# Patient Record
Sex: Female | Born: 1970 | Race: White | Hispanic: No | Marital: Married | Smoking: Never smoker
Health system: Southern US, Community
[De-identification: ages and names within clinical notes are randomized; demographics above are authoritative.]

## PROBLEM LIST (undated history)

## (undated) DIAGNOSIS — K5792 Diverticulitis of intestine, part unspecified, without perforation or abscess without bleeding: Secondary | ICD-10-CM

## (undated) DIAGNOSIS — E079 Disorder of thyroid, unspecified: Secondary | ICD-10-CM

---
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
## 1999-10-10 ENCOUNTER — Other Ambulatory Visit: Admission: RE | Admit: 1999-10-10 | Discharge: 1999-10-10 | Payer: Self-pay | Admitting: Gynecology

## 1999-12-21 ENCOUNTER — Other Ambulatory Visit: Admission: RE | Admit: 1999-12-21 | Discharge: 1999-12-21 | Payer: Self-pay | Admitting: Obstetrics and Gynecology

## 2001-03-13 ENCOUNTER — Other Ambulatory Visit: Admission: RE | Admit: 2001-03-13 | Discharge: 2001-03-13 | Payer: Self-pay | Admitting: Obstetrics and Gynecology

## 2001-10-24 ENCOUNTER — Other Ambulatory Visit: Admission: RE | Admit: 2001-10-24 | Discharge: 2001-10-24 | Payer: Self-pay | Admitting: Obstetrics and Gynecology

## 2001-10-30 ENCOUNTER — Encounter: Payer: Self-pay | Admitting: Surgery

## 2001-10-30 ENCOUNTER — Encounter: Admission: RE | Admit: 2001-10-30 | Discharge: 2001-10-30 | Payer: Self-pay | Admitting: Surgery

## 2004-07-27 ENCOUNTER — Emergency Department: Payer: Self-pay | Admitting: Internal Medicine

## 2006-10-25 ENCOUNTER — Emergency Department: Payer: Self-pay | Admitting: Emergency Medicine

## 2007-08-02 ENCOUNTER — Emergency Department: Payer: Self-pay | Admitting: Emergency Medicine

## 2010-01-25 ENCOUNTER — Encounter: Payer: Self-pay | Admitting: Pediatric Cardiology

## 2012-03-21 ENCOUNTER — Ambulatory Visit: Payer: Self-pay | Admitting: Internal Medicine

## 2012-03-21 LAB — CBC WITH DIFFERENTIAL/PLATELET
Basophil #: 0 10*3/uL (ref 0.0–0.1)
Basophil %: 0.7 %
Eosinophil #: 0.1 10*3/uL (ref 0.0–0.7)
Eosinophil %: 1.4 %
HCT: 40.7 % (ref 35.0–47.0)
HGB: 14 g/dL (ref 12.0–16.0)
Lymphocyte #: 2.3 10*3/uL (ref 1.0–3.6)
Lymphocyte %: 38 %
MCH: 33.5 pg (ref 26.0–34.0)
MCHC: 34.5 g/dL (ref 32.0–36.0)
MCV: 97 fL (ref 80–100)
Monocyte #: 0.5 x10 3/mm (ref 0.2–0.9)
Monocyte %: 8.6 %
Neutrophil #: 3.1 10*3/uL (ref 1.4–6.5)
Neutrophil %: 51.3 %
Platelet: 159 10*3/uL (ref 150–440)
RBC: 4.19 10*6/uL (ref 3.80–5.20)
RDW: 12.4 % (ref 11.5–14.5)
WBC: 6 10*3/uL (ref 3.6–11.0)

## 2012-03-21 LAB — SEDIMENTATION RATE: Erythrocyte Sed Rate: 3 mm/hr (ref 0–20)

## 2013-06-19 IMAGING — CR DG HIP COMPLETE 2+V*L*
1 series · 2 of 2 positions shown · non-contrast
Comparison: none

REASON FOR EXAM: Pain L hip/groin x 8 wks, worsening, esp in last 2d
COMMENTS:

PROCEDURE:     MDR - MDR HIP LEFT COMPLETE  - March 21, 2012  [DATE]
RESULT:     AP and lateral views of the left hip reveal no evidence of
fracture nor dislocation or significant degenerative change. The joint space
is preserved. The bones appear adequately mineralized for age.

[Series 1: ap · 0.17mm/px · 2 of 2 slices shown]
[im 1/2]
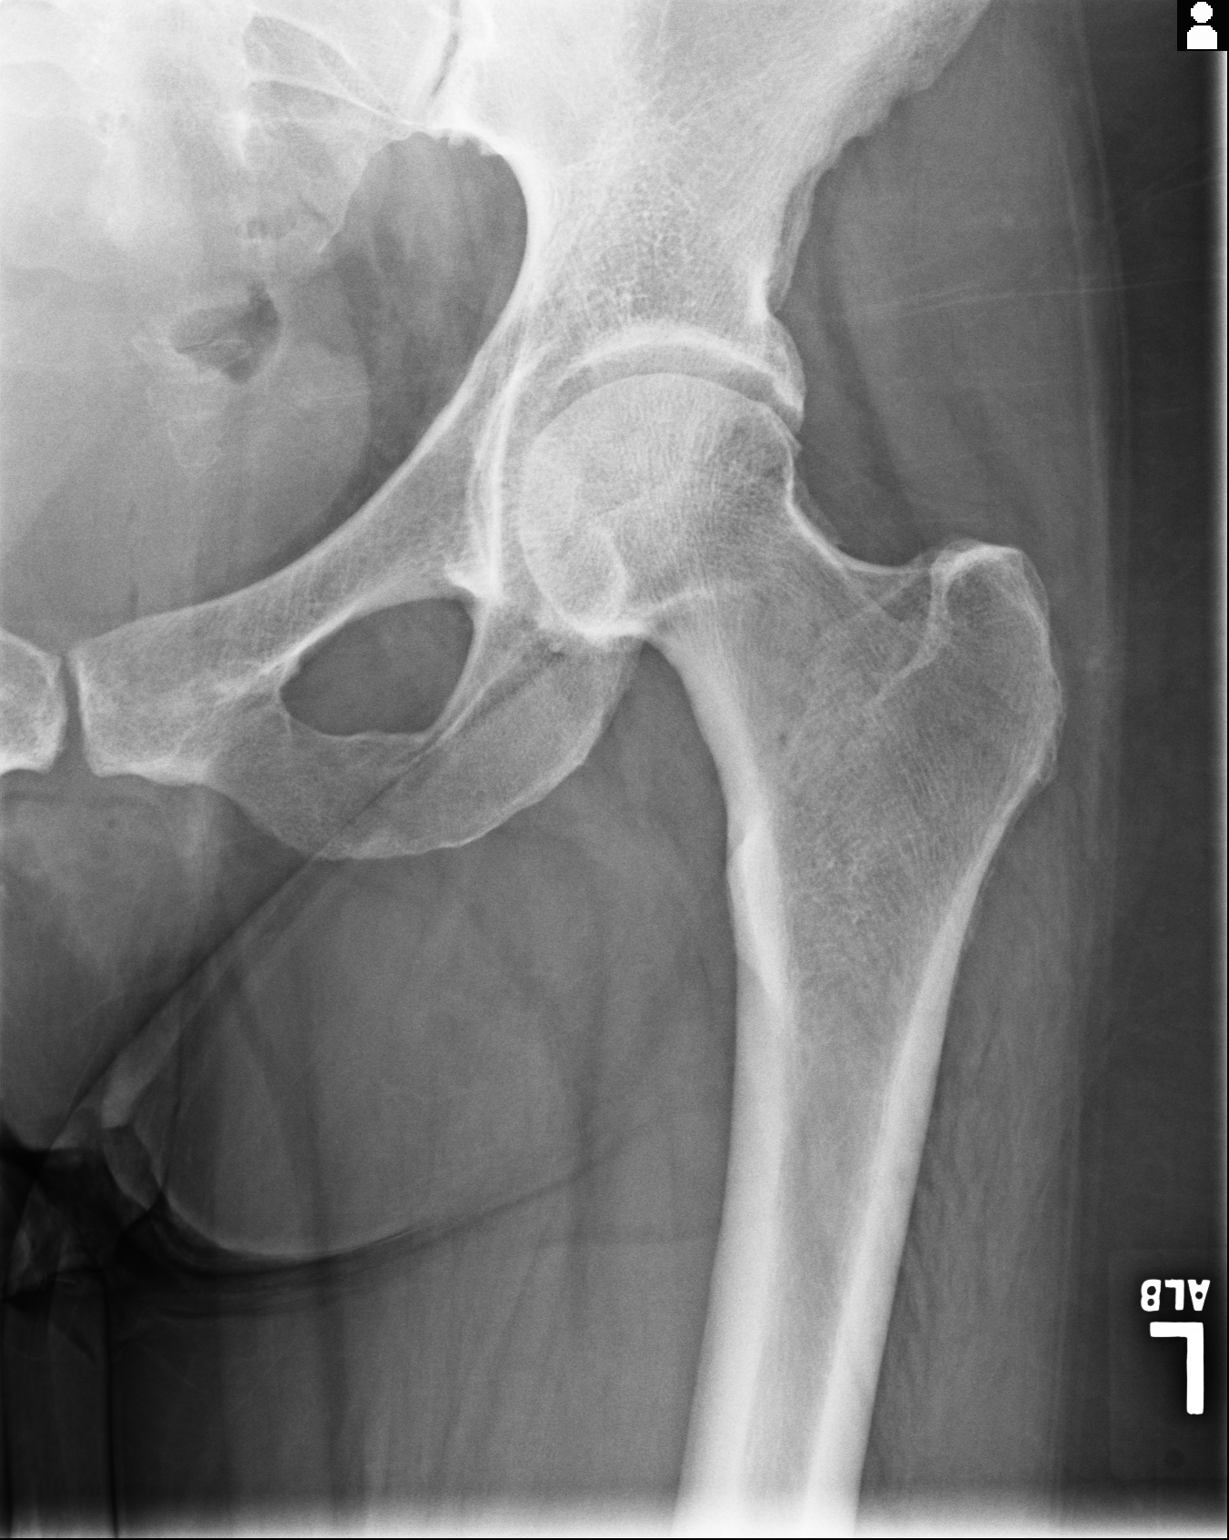
[im 2/2]
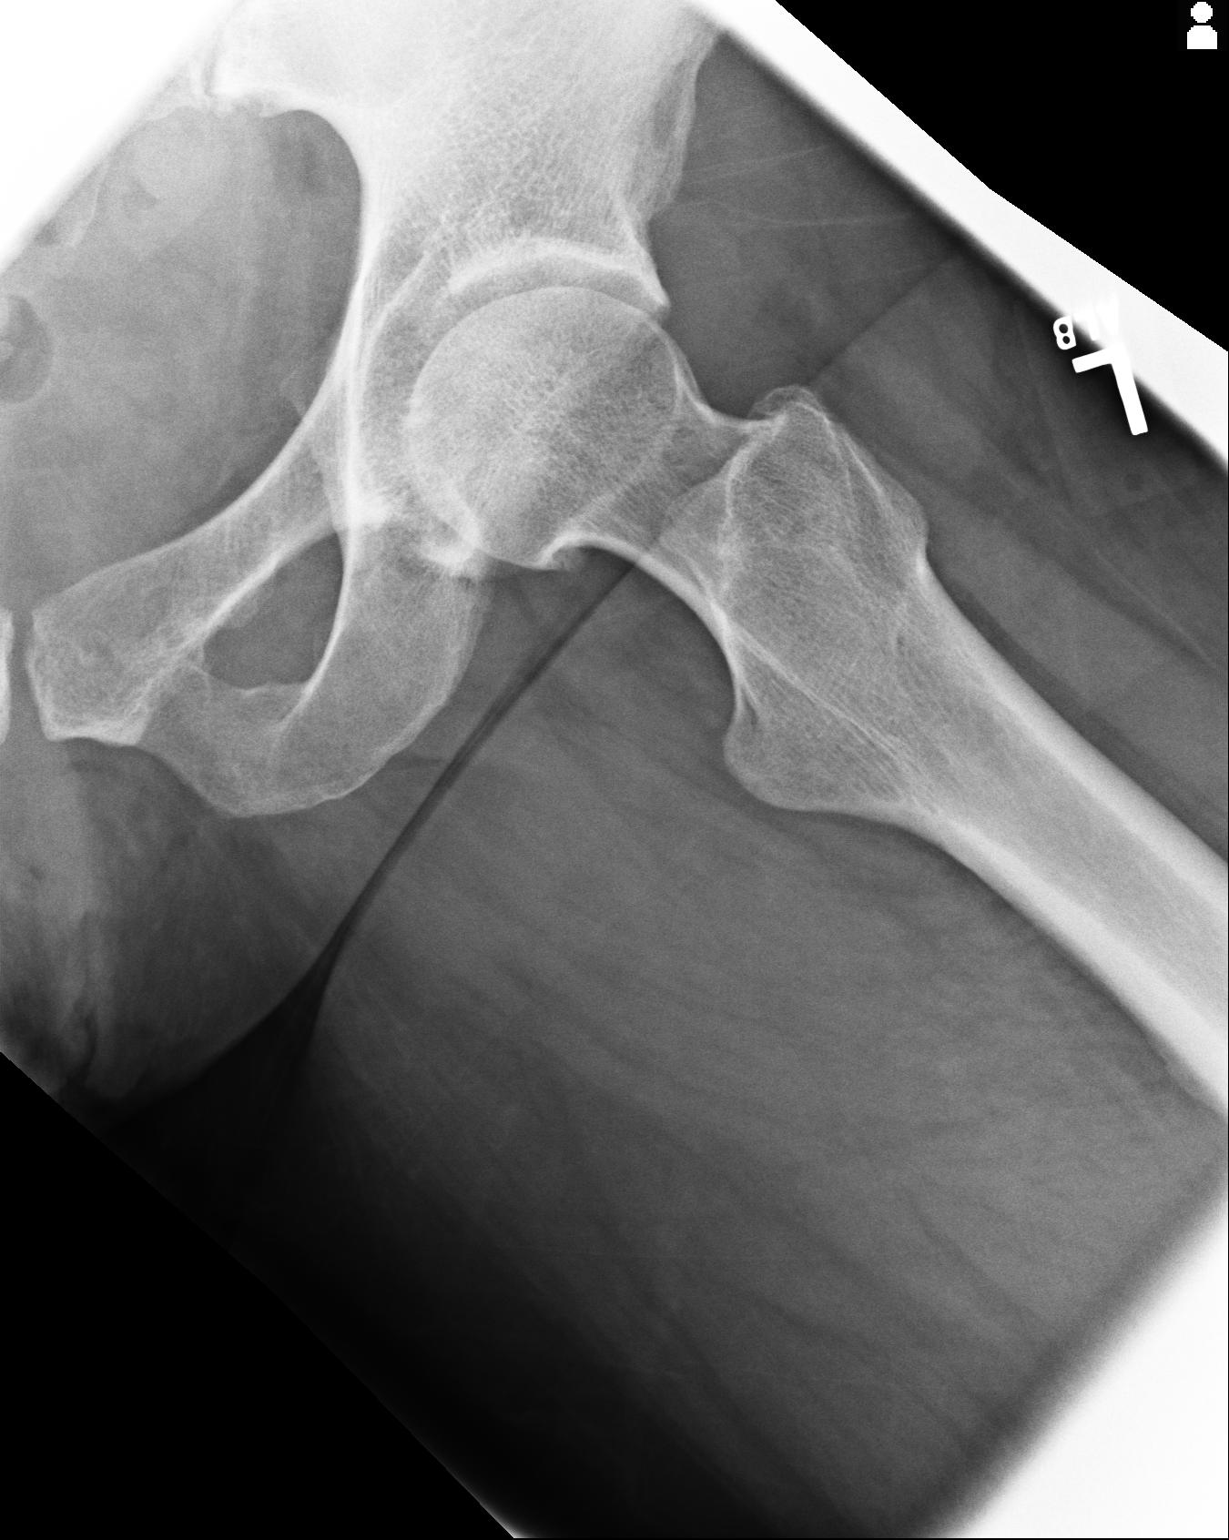

[2 of 2 positions shown; findings below may reference images not displayed]

IMPRESSION: There is no acute bony abnormality of the left hip.

## 2014-03-04 DIAGNOSIS — O09523 Supervision of elderly multigravida, third trimester: Secondary | ICD-10-CM | POA: Insufficient documentation

## 2014-03-04 DIAGNOSIS — Z86718 Personal history of other venous thrombosis and embolism: Secondary | ICD-10-CM | POA: Insufficient documentation

## 2014-03-04 DIAGNOSIS — Z6791 Unspecified blood type, Rh negative: Secondary | ICD-10-CM | POA: Insufficient documentation

## 2014-04-06 DIAGNOSIS — Z8279 Family history of other congenital malformations, deformations and chromosomal abnormalities: Secondary | ICD-10-CM | POA: Insufficient documentation

## 2014-08-04 DIAGNOSIS — Z3A32 32 weeks gestation of pregnancy: Secondary | ICD-10-CM | POA: Insufficient documentation

## 2014-08-04 DIAGNOSIS — O0993 Supervision of high risk pregnancy, unspecified, third trimester: Secondary | ICD-10-CM | POA: Insufficient documentation

## 2014-10-22 DIAGNOSIS — G8929 Other chronic pain: Secondary | ICD-10-CM | POA: Insufficient documentation

## 2014-10-22 DIAGNOSIS — R7302 Impaired glucose tolerance (oral): Secondary | ICD-10-CM | POA: Insufficient documentation

## 2014-10-22 DIAGNOSIS — M419 Scoliosis, unspecified: Secondary | ICD-10-CM | POA: Insufficient documentation

## 2016-06-24 DIAGNOSIS — K625 Hemorrhage of anus and rectum: Secondary | ICD-10-CM | POA: Insufficient documentation

## 2016-06-24 DIAGNOSIS — K529 Noninfective gastroenteritis and colitis, unspecified: Secondary | ICD-10-CM | POA: Insufficient documentation

## 2018-02-18 DIAGNOSIS — R0683 Snoring: Secondary | ICD-10-CM | POA: Insufficient documentation

## 2018-05-14 DIAGNOSIS — Z9884 Bariatric surgery status: Secondary | ICD-10-CM | POA: Insufficient documentation

## 2020-08-08 ENCOUNTER — Other Ambulatory Visit: Payer: Self-pay

## 2020-08-08 ENCOUNTER — Ambulatory Visit
Admission: RE | Admit: 2020-08-08 | Discharge: 2020-08-08 | Disposition: A | Discharge status: Home / Self Care | Payer: Federal, State, Local not specified - PPO | Source: Ambulatory Visit

## 2020-08-08 VITALS — BP 128/81 | HR 80 | Temp 98.7°F | Resp 17

## 2020-08-08 DIAGNOSIS — J4521 Mild intermittent asthma with (acute) exacerbation: Secondary | ICD-10-CM

## 2020-08-08 HISTORY — DX: Disorder of thyroid, unspecified: E07.9

## 2020-08-08 MED ORDER — ALBUTEROL SULFATE HFA 108 (90 BASE) MCG/ACT IN AERS: 2.0000 | INHALATION_SPRAY | RESPIRATORY_TRACT | 0 refills | Status: DC | PRN

## 2020-08-08 MED ORDER — AEROCHAMBER MV MISC: 2 refills | Status: DC

## 2020-08-08 MED ORDER — BENZONATATE 100 MG PO CAPS: 200.0000 mg | ORAL_CAPSULE | Freq: Three times a day (TID) | ORAL | 0 refills | Status: DC

## 2020-08-08 NOTE — ED Provider Notes (Signed)
[facility]    CSN: [id number] Arrival date & time: 08/08/20  1345      History   Chief Complaint Chief Complaint  Patient presents with  . Appointment  . Post Covid 07/25/2020    HPI Kristi White is a 49 y.o. female.   HPI   49 year old female here for evaluation of lingering cough post Covid.  Patient is currently on day 14 post Covid.  She just finished a Z-Pak prescribed by her primary care provider.  Patient states that she is having a productive cough for a yellow sputum, wheezing at night, has had a bit of a runny nose and some sinus pressure.  Patient denies fever or shortness of breath.  Past Medical History:  Diagnosis Date  . Thyroid disease     There are no problems to display for this patient.   History reviewed. No pertinent surgical history.  OB History   No obstetric history on file.      Home Medications    Prior to Admission medications   Medication Sig Start Date End Date Taking? Authorizing Provider  levothyroxine (SYNTHROID) 150 MCG tablet Take 150 mcg by mouth daily before breakfast.   Yes [provider]  albuterol (VENTOLIN HFA) 108 (90 Base) MCG/ACT inhaler Inhale 2 puffs into the lungs every 4 (four) hours as needed for wheezing or shortness of breath. 08/08/20   Becky Augusta, NP  benzonatate (TESSALON) 100 MG capsule Take 2 capsules (200 mg total) by mouth every 8 (eight) hours. 08/08/20   Becky Augusta, NP  Spacer/Aero-Holding Deretha Emory (AEROCHAMBER MV) inhaler Use as instructed 08/08/20   Becky Augusta, NP    Family History Family History  Family history unknown: Yes    Social History Social History   Tobacco Use  . Smoking status: Never Smoker  Substance Use Topics  . Alcohol use: Not on file  . Drug use: Not on file     Allergies   Patient has no known allergies.   Review of Systems Review of Systems  Constitutional: Negative for activity change, appetite change and fever.  HENT: Positive for  rhinorrhea and sinus pressure. Negative for congestion and sore throat.   Respiratory: Positive for cough and wheezing. Negative for shortness of breath.   Cardiovascular: Negative for chest pain.  Musculoskeletal: Negative for arthralgias and myalgias.  Skin: Negative for rash.  Neurological: Negative for headaches.  Hematological: Negative.   Psychiatric/Behavioral: Negative.      Physical Exam Triage Vital Signs ED Triage Vitals  Enc Vitals Group     BP 08/08/20 1434 128/81     Pulse Rate 08/08/20 1434 80     Resp 08/08/20 1434 17     Temp 08/08/20 1434 98.7 F (37.1 C)     Temp Source 08/08/20 1434 Oral     SpO2 08/08/20 1434 100 %     Weight --      Height --      Head Circumference --      Peak Flow --      Pain Score 08/08/20 1428 4     Pain Loc --      Pain Edu? --      Excl. in GC? --    No data found.  Updated Vital Signs BP 128/81 (BP Location: Left Arm)   Pulse 80   Temp 98.7 F (37.1 C) (Oral)   Resp 17   SpO2 100%   Visual Acuity Right Eye Distance:   Left Eye  Distance:   Bilateral Distance:    Right Eye Near:   Left Eye Near:    Bilateral Near:     Physical Exam Vitals and nursing note reviewed.  Constitutional:      General: She is not in acute distress.    Appearance: Normal appearance. She is normal weight. She is not toxic-appearing.  HENT:     Head: Normocephalic and atraumatic.     Right Ear: Tympanic membrane, ear canal and external ear normal.     Left Ear: Tympanic membrane, ear canal and external ear normal.     Nose: Nose normal. No congestion or rhinorrhea.     Mouth/Throat:     Mouth: Mucous membranes are moist.     Pharynx: Oropharynx is clear. No oropharyngeal exudate or posterior oropharyngeal erythema.  Eyes:     General: No scleral icterus.    Extraocular Movements: Extraocular movements intact.     Conjunctiva/sclera: Conjunctivae normal.     Pupils: Pupils are equal, round, and reactive to light.  Cardiovascular:       Rate and Rhythm: Normal rate and regular rhythm.     Pulses: Normal pulses.     Heart sounds: Normal heart sounds. No murmur heard.  No gallop.   Pulmonary:     Effort: Pulmonary effort is normal.     Breath sounds: Wheezing present. No rhonchi or rales.  Musculoskeletal:        General: No swelling or tenderness. Normal range of motion.     Cervical back: Normal range of motion and neck supple.  Lymphadenopathy:     Cervical: No cervical adenopathy.  Skin:    General: Skin is warm and dry.     Capillary Refill: Capillary refill takes less than 2 seconds.     Findings: No erythema or rash.  Neurological:     General: No focal deficit present.     Mental Status: She is alert and oriented to person, place, and time.  Psychiatric:        Mood and Affect: Mood normal.        Behavior: Behavior normal.        Thought Content: Thought content normal.        Judgment: Judgment normal.      UC Treatments / Results  Labs (all labs ordered are listed, but only abnormal results are displayed) Labs Reviewed - No data to display  EKG   Radiology No results found.  Procedures Procedures (including critical care time)  Medications Ordered in UC Medications - No data to display  Initial Impression / Assessment and Plan / UC Course  I have reviewed the triage vital signs and the nursing notes.  Pertinent labs & imaging results that were available during my care of the patient were reviewed by me and considered in my medical decision making (see chart for details).   Of continued cough that is productive for yellow sputum and wheezing at night 14 days post Covid.  Patient has remote history of asthma but does not have a rescue inhaler at present.  Patient states that she has had a runny nose and some sinus pressure.  Physical exam reveals pink and moist nasal mucosa with mild edema and no discharge.  No postnasal drip visualized in the posterior oropharynx.  Lung sounds are  decreased in all fields with wheezing present.  Suspect patient has an asthma exacerbation and that is what is driving her cough.  We will treat with albuterol inhaler and  spacer, give Tessalon Perles to help with cough as the Robitussin with codeine that she was prescribed by her PCP makes her too sleepy.   Final Clinical Impressions(s) / UC Diagnoses   Final diagnoses:  Mild intermittent asthma with acute exacerbation     Discharge Instructions     Use the inhaler, 2 puffs every 4-6 hours, as needed for cough and wheezing.  Use it with the spacer.  Use the Tessalon Perles every 8 hours as needed for cough.  Increase your oral fluid intake to keep your mucus thin and help expectorate your sputum.  You can continue your Mucinex.  If your symptoms persist you may need a round of steroids and I would recommend following up with your PCP.    ED Prescriptions    Medication Sig Dispense Auth. Provider   albuterol (VENTOLIN HFA) 108 (90 Base) MCG/ACT inhaler Inhale 2 puffs into the lungs every 4 (four) hours as needed for wheezing or shortness of breath. 18 g Becky Augusta, NP   Spacer/Aero-Holding Chambers (AEROCHAMBER MV) inhaler Use as instructed 1 each Becky Augusta, NP   benzonatate (TESSALON) 100 MG capsule Take 2 capsules (200 mg total) by mouth every 8 (eight) hours. 21 capsule Becky Augusta, NP     PDMP not reviewed this encounter.   Becky Augusta, NP 08/08/20 [phone]

## 2020-08-08 NOTE — Discharge Instructions (Addendum)
Use the inhaler, 2 puffs every 4-6 hours, as needed for cough and wheezing.  Use it with the spacer.  Use the Tessalon Perles every 8 hours as needed for cough.  Increase your oral fluid intake to keep your mucus thin and help expectorate your sputum.  You can continue your Mucinex.  If your symptoms persist you may need a round of steroids and I would recommend following up with your PCP.

## 2020-08-08 NOTE — ED Triage Notes (Signed)
Pt presents for possible chest x ray post covid because of ongoing cough with her given asthma/bronchitis history

## 2020-11-28 ENCOUNTER — Other Ambulatory Visit: Payer: Self-pay

## 2020-11-28 ENCOUNTER — Ambulatory Visit
Admission: RE | Admit: 2020-11-28 | Discharge: 2020-11-28 | Disposition: A | Discharge status: Home / Self Care | Payer: Federal, State, Local not specified - PPO | Source: Ambulatory Visit | Attending: Family Medicine | Admitting: Family Medicine

## 2020-11-28 VITALS — BP 133/99 | HR 79 | Temp 97.9°F | Resp 18 | Ht 67.0 in | Wt 228.0 lb

## 2020-11-28 DIAGNOSIS — K5732 Diverticulitis of large intestine without perforation or abscess without bleeding: Secondary | ICD-10-CM

## 2020-11-28 HISTORY — DX: Diverticulitis of intestine, part unspecified, without perforation or abscess without bleeding: K57.92

## 2020-11-28 MED ORDER — METRONIDAZOLE 500 MG PO TABS: 500.0000 mg | ORAL_TABLET | Freq: Three times a day (TID) | ORAL | 0 refills | Status: AC

## 2020-11-28 MED ORDER — ONDANSETRON 4 MG PO TBDP: 4.0000 mg | ORAL_TABLET | Freq: Three times a day (TID) | ORAL | 0 refills | Status: DC | PRN

## 2020-11-28 MED ORDER — CIPROFLOXACIN HCL 500 MG PO TABS: 500.0000 mg | ORAL_TABLET | Freq: Two times a day (BID) | ORAL | 0 refills | Status: DC

## 2020-11-28 NOTE — ED Provider Notes (Signed)
[facility]    CSN: [id number] Arrival date & time: 11/28/20  0901      History   Chief Complaint Chief Complaint  Patient presents with  . Diarrhea  . Abdominal Pain   HPI  50 year old female presents with the above complaints.  Symptoms started Friday. She reports fever (101), nausea, vomiting, diarrhea, LLQ pain. Has a history of diverticulitis. She believes that this is the culprit of her symptoms. She has taken tylenol with improvement in fever. Pain 3/10 in severity. No other reported symptoms. No other complaints.  Past Medical History:  Diagnosis Date  . Diverticulitis   . Thyroid disease    Home Medications    Prior to Admission medications   Medication Sig Start Date End Date Taking? Authorizing Provider  albuterol (VENTOLIN HFA) 108 (90 Base) MCG/ACT inhaler Inhale 2 puffs into the lungs every 4 (four) hours as needed for wheezing or shortness of breath. 08/08/20  Yes Becky Augusta, NP  ciprofloxacin (CIPRO) 500 MG tablet Take 1 tablet (500 mg total) by mouth 2 (two) times daily. 11/28/20  Yes Jalie Eiland G, DO  gabapentin (NEURONTIN) 300 MG capsule Take 300 mg by mouth at bedtime. 09/07/20  Yes [provider]  levothyroxine (SYNTHROID) 150 MCG tablet Take 150 mcg by mouth daily before breakfast.   Yes [provider]  metroNIDAZOLE (FLAGYL) 500 MG tablet Take 1 tablet (500 mg total) by mouth 3 (three) times daily for 7 days. 11/28/20 12/05/20 Yes Fayth Trefry G, DO  ondansetron (ZOFRAN ODT) 4 MG disintegrating tablet Take 1 tablet (4 mg total) by mouth every 8 (eight) hours as needed for nausea or vomiting. 11/28/20  Yes Tommie Sams, DO    Family History Family History  Problem Relation Age of Onset  . Cancer Mother   . Stroke Father     Social History Social History   Tobacco Use  . Smoking status: Never Smoker  . Smokeless tobacco: Never Used  Vaping Use  . Vaping Use: Never used  Substance Use Topics  . Drug use: Never      Allergies   Latex   Review of Systems Review of Systems  Constitutional: Positive for fever.  Gastrointestinal: Positive for abdominal pain, diarrhea and nausea.   Physical Exam Triage Vital Signs ED Triage Vitals  Enc Vitals Group     BP 11/28/20 0913 (!) 133/99     Pulse Rate 11/28/20 0913 79     Resp 11/28/20 0913 18     Temp 11/28/20 0913 97.9 F (36.6 C)     Temp Source 11/28/20 0913 Oral     SpO2 11/28/20 0913 99 %     Weight 11/28/20 0911 228 lb (103.4 kg)     Height 11/28/20 0911 5\' 7"  (1.702 m)     Head Circumference --      Peak Flow --      Pain Score 11/28/20 0910 3     Pain Loc --      Pain Edu? --      Excl. in GC? --    Updated Vital Signs BP (!) 133/99 (BP Location: Left Arm)   Pulse 79   Temp 97.9 F (36.6 C) (Oral)   Resp 18   Ht 5\' 7"  (1.702 m)   Wt 103.4 kg   SpO2 99%   BMI 35.71 kg/m   Visual Acuity Right Eye Distance:   Left Eye Distance:   Bilateral Distance:    Right Eye  Near:   Left Eye Near:    Bilateral Near:     Physical Exam Vitals and nursing note reviewed.  Constitutional:      General: She is not in acute distress.    Appearance: Normal appearance. She is not ill-appearing.  HENT:     Head: Normocephalic and atraumatic.  Eyes:     General:        Right eye: No discharge.        Left eye: No discharge.     Conjunctiva/sclera: Conjunctivae normal.  Cardiovascular:     Rate and Rhythm: Normal rate and regular rhythm.     Heart sounds: No murmur heard.   Pulmonary:     Effort: Pulmonary effort is normal.     Breath sounds: Normal breath sounds. No wheezing, rhonchi or rales.  Abdominal:     General: There is no distension.     Palpations: Abdomen is soft.     Comments: Mild LLQ tenderness to palpation.  Neurological:     Mental Status: She is alert.  Psychiatric:        Mood and Affect: Mood normal.        Behavior: Behavior normal.    UC Treatments / Results  Labs (all labs ordered are listed, but  only abnormal results are displayed) Labs Reviewed - No data to display  EKG   Radiology No results found.  Procedures Procedures (including critical care time)  Medications Ordered in UC Medications - No data to display  Initial Impression / Assessment and Plan / UC Course  I have reviewed the triage vital signs and the nursing notes.  Pertinent labs & imaging results that were available during my care of the patient were reviewed by me and considered in my medical decision making (see chart for details).    50 year old female presents with diverticulitis. Acute illness with systemic symptoms (has had fever). Treating with Cipro and Flagyl. PRN Zofran. Work note given.  Final Clinical Impressions(s) / UC Diagnoses   Final diagnoses:  Diverticulitis of colon   Discharge Instructions   None    ED Prescriptions    Medication Sig Dispense Auth. Provider   ciprofloxacin (CIPRO) 500 MG tablet Take 1 tablet (500 mg total) by mouth 2 (two) times daily. 14 tablet Bartlomiej Jenkinson G, DO   metroNIDAZOLE (FLAGYL) 500 MG tablet Take 1 tablet (500 mg total) by mouth 3 (three) times daily for 7 days. 21 tablet Clessie Karras G, DO   ondansetron (ZOFRAN ODT) 4 MG disintegrating tablet Take 1 tablet (4 mg total) by mouth every 8 (eight) hours as needed for nausea or vomiting. 20 tablet Tommie Sams, DO     PDMP not reviewed this encounter.   Tommie Sams, [place] 11/28/20 [phone]

## 2020-11-28 NOTE — ED Triage Notes (Signed)
Pt c/o nausea, emesis, diarrhea, low fever, LLQ pain since Friday. Pt is concerned about a diverticulitis flare.

## 2021-03-23 DIAGNOSIS — E7439 Other disorders of intestinal carbohydrate absorption: Secondary | ICD-10-CM | POA: Insufficient documentation

## 2022-03-31 DIAGNOSIS — E538 Deficiency of other specified B group vitamins: Secondary | ICD-10-CM | POA: Insufficient documentation

## 2022-03-31 DIAGNOSIS — Z833 Family history of diabetes mellitus: Secondary | ICD-10-CM | POA: Insufficient documentation

## 2022-07-26 ENCOUNTER — Ambulatory Visit: Payer: Federal, State, Local not specified - PPO | Admitting: Podiatry

## 2022-11-22 ENCOUNTER — Ambulatory Visit: Payer: Federal, State, Local not specified - PPO | Admitting: Podiatry

## 2022-12-13 ENCOUNTER — Encounter: Payer: Self-pay | Admitting: Podiatry

## 2022-12-13 ENCOUNTER — Ambulatory Visit: Payer: Federal, State, Local not specified - PPO | Admitting: Podiatry

## 2022-12-13 ENCOUNTER — Ambulatory Visit (INDEPENDENT_AMBULATORY_CARE_PROVIDER_SITE_OTHER): Payer: Federal, State, Local not specified - PPO

## 2022-12-13 DIAGNOSIS — K449 Diaphragmatic hernia without obstruction or gangrene: Secondary | ICD-10-CM | POA: Insufficient documentation

## 2022-12-13 DIAGNOSIS — M6740 Ganglion, unspecified site: Secondary | ICD-10-CM

## 2022-12-13 NOTE — Progress Notes (Signed)
  Subjective:  Patient ID: Kristi White, female    DOB: [birth year],  MRN: [id number] HPI Chief Complaint  Patient presents with   Toe Pain     2nd toe right - cyst at dipj x 6-8 months, starting to get tender, PCP referred for treatment   New Patient (Initial Visit)    52 y.o. female presents with the above complaint.   ROS: Denies fever chills nausea vomit muscle aches pains calf pain back pain chest pain shortness of breath.  Past Medical History:  Diagnosis Date   Diverticulitis    Thyroid disease    No past surgical history on file.  Current Outpatient Medications:    levothyroxine (SYNTHROID) 150 MCG tablet, Take 150 mcg by mouth daily before breakfast., Disp: , Rfl:   Allergies  Allergen Reactions   Latex Rash   Review of Systems Objective:  There were no vitals filed for this visit.  General: Well developed, nourished, in no acute distress, alert and oriented x3   Dermatological: Skin is warm, dry and supple bilateral. Nails x 10 are well maintained; remaining integument appears unremarkable at this time. There are no open sores, no preulcerative lesions, no rash or signs of infection present.  Vascular: Dorsalis Pedis artery and Posterior Tibial artery pedal pulses are 2/4 bilateral with immedate capillary fill time. Pedal hair growth present. No varicosities and no lower extremity edema present bilateral.   Neruologic: Grossly intact via light touch bilateral. Vibratory intact via tuning fork bilateral. Protective threshold with Semmes Wienstein monofilament intact to all pedal sites bilateral. Patellar and Achilles deep tendon reflexes 2+ bilateral. No Babinski or clonus noted bilateral.   Musculoskeletal: No gross boney pedal deformities bilateral. No pain, crepitus, or limitation noted with foot and ankle range of motion bilateral. Muscular strength 5/5 in all groups tested bilateral.  Mild mallet toe deformity with some early osteoarthritic changes second  digit right foot with a 0.5 cm mucoid cyst to the dorsal lateral aspect of the joint.  Gait: Unassisted, Nonantalgic.    Radiographs:  Radiographs taken today demonstrate an osseously mature foot evaluation of the digits do demonstrate normal osseous architecture however the DIPJ of the second digit of the right foot does demonstrate joint space narrowing subchondral sclerosis and eburnation.  No signs of infection.  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  She tolerated the procedure well antibiotic ointment was placed and a compression dressing was applied recommended that she continue the compression and compression dressing for the next few days.  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     Real Cona T. Harrison, [place]

## 2024-07-11 ENCOUNTER — Ambulatory Visit
Admission: RE | Admit: 2024-07-11 | Discharge: 2024-07-11 | Disposition: A | Discharge status: Home / Self Care | Payer: Self-pay | Attending: Physician Assistant | Admitting: Physician Assistant

## 2024-07-11 VITALS — BP 175/92 | HR 78 | Temp 98.0°F | Resp 15 | Ht 67.0 in | Wt 228.0 lb

## 2024-07-11 DIAGNOSIS — R490 Dysphonia: Secondary | ICD-10-CM

## 2024-07-11 DIAGNOSIS — J069 Acute upper respiratory infection, unspecified: Secondary | ICD-10-CM | POA: Diagnosis not present

## 2024-07-11 DIAGNOSIS — I10 Essential (primary) hypertension: Secondary | ICD-10-CM

## 2024-07-11 DIAGNOSIS — R051 Acute cough: Secondary | ICD-10-CM | POA: Diagnosis not present

## 2024-07-11 DIAGNOSIS — J45901 Unspecified asthma with (acute) exacerbation: Secondary | ICD-10-CM | POA: Diagnosis not present

## 2024-07-11 LAB — POC SOFIA SARS ANTIGEN FIA: SARS Coronavirus 2 Ag: NEGATIVE

## 2024-07-11 MED ORDER — PSEUDOEPH-BROMPHEN-DM 30-2-10 MG/5ML PO SYRP: 10.0000 mL | ORAL_SOLUTION | Freq: Four times a day (QID) | ORAL | 0 refills | Status: AC | PRN

## 2024-07-11 MED ORDER — PREDNISONE 20 MG PO TABS
40.0000 mg | ORAL_TABLET | Freq: Every day | ORAL | 0 refills | Status: AC
Start: 2024-07-11 — End: 2024-07-16

## 2024-07-11 NOTE — Discharge Instructions (Addendum)
-  Negative COVID test.   URI/COLD SYMPTOMS: Your exam today is consistent with a viral illness. Antibiotics are not indicated at this time. Use medications as directed, including cough syrup, nasal saline, and decongestants. Your symptoms should improve over the next few days and resolve within 7-10 days. Increase rest and fluids. F/u if symptoms worsen or predominate such as sore throat, ear pain, productive cough, shortness of breath, or if you develop high fevers or worsening fatigue over the next several days.   

## 2024-07-11 NOTE — ED Provider Notes (Signed)
 [facility]    CSN: [id number] Arrival date & time: 07/11/24  0813      History   Chief Complaint Chief Complaint  Patient presents with   Cough    Appointment    HPI Kristi White is a 53 y.o. female presenting for fatigue, cough, congestion, headaches, and body aches x 3 days. Denies fever, sore throat, ear pain, sinus pain, chest pain, abdominal pain, vomiting or diarrhea. History of RAD. Reports wheezing and mildly increased SOB from baseline.  Patient has been exposed to COVID. Patient has been taking over-the-counter meds--Dayquil and Nyquil. No other complaints.   HPI  Past Medical History:  Diagnosis Date   Diverticulitis    Thyroid disease     Patient Active Problem List   Diagnosis Date Noted   Hiatal hernia 12/13/2022   B12 deficiency 03/31/2022   Family history of type 2 diabetes mellitus 03/31/2022   Glucose intolerance 03/23/2021   History of bariatric surgery 05/14/2018   Morbid (severe) obesity due to excess calories (HCC) 04/26/2018   Snoring 02/18/2018   Chronic diarrhea 06/24/2016   Rectal bleeding 06/24/2016   Chronic back pain 10/22/2014   Glucose intolerance (impaired glucose tolerance) 10/22/2014   Scoliosis of thoracolumbar spine 10/22/2014   [redacted] weeks gestation of pregnancy 08/04/2014   High-risk pregnancy supervision, third trimester 08/04/2014   Family history of congenital heart defect 04/06/2014   AMA (advanced maternal age) multigravida 35+, third trimester 03/04/2014   Blood type, Rh negative 03/04/2014   History of DVT of lower extremity 03/04/2014   Hypothyroidism (acquired) 12/12/1990    History reviewed. No pertinent surgical history.  OB History   No obstetric history on file.      Home Medications    Prior to Admission medications   Medication Sig Start Date End Date Taking? Authorizing Provider  brompheniramine-pseudoephedrine-DM 30-2-10 MG/5ML syrup Take 10 mLs by mouth 4 (four) times daily as needed  for up to 7 days. 07/11/24 07/18/24 Yes Arvis Jolan NOVAK, PA-C  predniSONE (DELTASONE) 20 MG tablet Take 2 tablets (40 mg total) by mouth daily for 5 days. 07/11/24 07/16/24 Yes Arvis Jolan NOVAK, PA-C  levothyroxine (SYNTHROID) 150 MCG tablet Take 150 mcg by mouth daily before breakfast.    [provider]    Family History Family History  Problem Relation Age of Onset   Cancer Mother    Stroke Father     Social History Social History   Tobacco Use   Smoking status: Never   Smokeless tobacco: Never  Vaping Use   Vaping status: Never Used  Substance Use Topics   Drug use: Never     Allergies   Latex   Review of Systems Review of Systems  Constitutional:  Positive for fatigue. Negative for chills, diaphoresis and fever.  HENT:  Positive for congestion, postnasal drip and rhinorrhea. Negative for ear pain, sinus pain and sore throat.   Respiratory:  Positive for cough, shortness of breath and wheezing.   Cardiovascular:  Negative for chest pain.  Gastrointestinal:  Negative for abdominal pain, nausea and vomiting.  Musculoskeletal:  Positive for myalgias.  Skin:  Negative for rash.  Neurological:  Positive for headaches. Negative for weakness.  Hematological:  Negative for adenopathy.     Physical Exam Triage Vital Signs ED Triage Vitals [07/11/24 0822]  Encounter Vitals Group     BP      Girls Systolic BP Percentile      Girls Diastolic BP Percentile  Boys Systolic BP Percentile      Boys Diastolic BP Percentile      Pulse      Resp      Temp      Temp src      SpO2      Weight 227 lb 15.3 oz (103.4 kg)     Height 5' 7 (1.702 m)     Head Circumference      Peak Flow      Pain Score 0     Pain Loc      Pain Education      Exclude from Growth Chart    No data found.  Updated Vital Signs BP (!) 175/92 (BP Location: Right Arm)   Pulse 78   Temp 98 F (36.7 C) (Oral)   Resp 15   Ht 5' 7 (1.702 m)   Wt 227 lb 15.3 oz (103.4 kg)   SpO2 96%    BMI 35.70 kg/m     Physical Exam Vitals and nursing note reviewed.  Constitutional:      General: She is not in acute distress.    Appearance: Normal appearance. She is ill-appearing. She is not toxic-appearing.     Comments: +voice hoarseness  HENT:     Head: Normocephalic and atraumatic.     Right Ear: Tympanic membrane, ear canal and external ear normal.     Left Ear: Tympanic membrane, ear canal and external ear normal.     Nose: Congestion present.     Mouth/Throat:     Mouth: Mucous membranes are moist.     Pharynx: Oropharynx is clear.  Eyes:     General: No scleral icterus.       Right eye: No discharge.        Left eye: No discharge.     Conjunctiva/sclera: Conjunctivae normal.  Cardiovascular:     Rate and Rhythm: Normal rate and regular rhythm.     Heart sounds: Normal heart sounds.  Pulmonary:     Effort: Pulmonary effort is normal. No respiratory distress.     Breath sounds: Normal breath sounds.  Musculoskeletal:     Cervical back: Neck supple.  Skin:    General: Skin is dry.  Neurological:     General: No focal deficit present.     Mental Status: She is alert. Mental status is at baseline.     Motor: No weakness.     Gait: Gait normal.  Psychiatric:        Mood and Affect: Mood normal.        Behavior: Behavior normal.      UC Treatments / Results  Labs (all labs ordered are listed, but only abnormal results are displayed) Labs Reviewed  POC SOFIA SARS ANTIGEN FIA - Normal    EKG   Radiology No results found.  Procedures Procedures (including critical care time)  Medications Ordered in UC Medications - No data to display  Initial Impression / Assessment and Plan / UC Course  I have reviewed the triage vital signs and the nursing notes.  Pertinent labs & imaging results that were available during my care of the patient were reviewed by me and considered in my medical decision making (see chart for details).   53 year old female  presents for cough, congestion, fatigue, headaches and bodyaches x 3 days.  Reports increased wheezing and shortness of breath from baseline.  History of RAD.  Has been using albuterol  more than normal.  Has been exposed to  COVID and needs a test for work.  She is afebrile. BP 174/96.  Recheck is 175/92.  Patient reports her typical blood pressures are 150s over 90s.  She is following up with primary care provider regarding this.  She thinks it is related to her thyroid which is not under control per patient.  Patient also reports trying to work on lifestyle changes to avoid medication.  She will continue to follow with primary care provider regarding elevated blood pressure readings.  She is mildly ill-appearing but nontoxic.  Voice is hoarse.  No acute distress.  On exam has nasal congestion.  Throat is clear.  Chest clear.  Heart regular rate rhythm.  COVID antigen test performed. Negative.  Viral URI and mild RAD flare.  No wheezing at this time the patient reports using her inhaler.  Will treat with Bromfed-DM and prednisone.  Continue inhalers.  Encourage increasing rest and fluids.  Explained viral illnesses can last a few weeks but she should return if she is having sinus discomfort after 10 days, fevers, worsening sore throat, increased chest discomfort or shortness of breath.   Final Clinical Impressions(s) / UC Diagnoses   Final diagnoses:  Acute cough  Viral upper respiratory tract infection  Voice hoarseness  Reactive airway disease with acute exacerbation, unspecified asthma severity, unspecified whether persistent     Discharge Instructions      -Negative COVID test.  URI/COLD SYMPTOMS: Your exam today is consistent with a viral illness. Antibiotics are not indicated at this time. Use medications as directed, including cough syrup, nasal saline, and decongestants. Your symptoms should improve over the next few days and resolve within 7-10 days. Increase rest and fluids. F/u  if symptoms worsen or predominate such as sore throat, ear pain, productive cough, shortness of breath, or if you develop high fevers or worsening fatigue over the next several days.       ED Prescriptions     Medication Sig Dispense Auth. Provider   brompheniramine-pseudoephedrine-DM 30-2-10 MG/5ML syrup Take 10 mLs by mouth 4 (four) times daily as needed for up to 7 days. 150 mL Arvis Huxley B, PA-C   predniSONE (DELTASONE) 20 MG tablet Take 2 tablets (40 mg total) by mouth daily for 5 days. 10 tablet Ralston Venus B, PA-C      PDMP not reviewed this encounter.   Arvis Huxley NOVAK, PA-C 07/11/24 [id number]

## 2024-07-11 NOTE — ED Triage Notes (Signed)
 Patient c/o cough, runny nose, post nasal drainage, bodyaches, and headache that started on Tuesday.  Patient denies fevers.
